# Patient Record
Sex: Female | Born: 1945 | Race: White | Hispanic: No | Marital: Married | Smoking: Never smoker
Health system: Southern US, Community
[De-identification: ages and names within clinical notes are randomized; demographics above are authoritative.]

## PROBLEM LIST (undated history)

## (undated) HISTORY — PX: CHOLECYSTECTOMY: SHX55

## (undated) HISTORY — PX: ABDOMINAL HYSTERECTOMY: SHX81

---

## 1997-11-30 ENCOUNTER — Ambulatory Visit (HOSPITAL_COMMUNITY): Admission: RE | Admit: 1997-11-30 | Discharge: 1997-11-30 | Payer: Self-pay | Admitting: Obstetrics and Gynecology

## 1998-12-02 ENCOUNTER — Encounter: Payer: Self-pay | Admitting: Obstetrics and Gynecology

## 1998-12-02 ENCOUNTER — Ambulatory Visit (HOSPITAL_COMMUNITY): Admission: RE | Admit: 1998-12-02 | Discharge: 1998-12-02 | Payer: Self-pay | Admitting: Obstetrics and Gynecology

## 1999-12-05 ENCOUNTER — Ambulatory Visit (HOSPITAL_COMMUNITY): Admission: RE | Admit: 1999-12-05 | Discharge: 1999-12-05 | Payer: Self-pay | Admitting: Internal Medicine

## 1999-12-05 ENCOUNTER — Encounter: Payer: Self-pay | Admitting: Internal Medicine

## 2000-12-05 ENCOUNTER — Ambulatory Visit (HOSPITAL_COMMUNITY): Admission: RE | Admit: 2000-12-05 | Discharge: 2000-12-05 | Payer: Self-pay | Admitting: Internal Medicine

## 2000-12-05 ENCOUNTER — Encounter: Payer: Self-pay | Admitting: Internal Medicine

## 2001-12-10 ENCOUNTER — Ambulatory Visit (HOSPITAL_COMMUNITY): Admission: RE | Admit: 2001-12-10 | Discharge: 2001-12-10 | Payer: Self-pay | Admitting: Internal Medicine

## 2001-12-10 ENCOUNTER — Encounter: Payer: Self-pay | Admitting: Internal Medicine

## 2003-01-04 ENCOUNTER — Ambulatory Visit (HOSPITAL_COMMUNITY): Admission: RE | Admit: 2003-01-04 | Discharge: 2003-01-04 | Payer: Self-pay | Admitting: Internal Medicine

## 2003-03-23 ENCOUNTER — Encounter (INDEPENDENT_AMBULATORY_CARE_PROVIDER_SITE_OTHER): Payer: Self-pay | Admitting: Specialist

## 2003-03-23 ENCOUNTER — Ambulatory Visit (HOSPITAL_COMMUNITY): Admission: RE | Admit: 2003-03-23 | Discharge: 2003-03-23 | Payer: Self-pay | Admitting: Gastroenterology

## 2004-01-10 ENCOUNTER — Ambulatory Visit (HOSPITAL_COMMUNITY): Admission: RE | Admit: 2004-01-10 | Discharge: 2004-01-10 | Payer: Self-pay | Admitting: Internal Medicine

## 2005-01-23 ENCOUNTER — Ambulatory Visit (HOSPITAL_COMMUNITY): Admission: RE | Admit: 2005-01-23 | Discharge: 2005-01-23 | Payer: Self-pay | Admitting: Internal Medicine

## 2006-01-24 ENCOUNTER — Encounter: Admission: RE | Admit: 2006-01-24 | Discharge: 2006-01-24 | Payer: Self-pay | Admitting: Internal Medicine

## 2007-01-27 ENCOUNTER — Encounter: Admission: RE | Admit: 2007-01-27 | Discharge: 2007-01-27 | Payer: Self-pay | Admitting: Internal Medicine

## 2008-01-29 ENCOUNTER — Encounter: Admission: RE | Admit: 2008-01-29 | Discharge: 2008-01-29 | Payer: Self-pay | Admitting: Internal Medicine

## 2009-01-31 ENCOUNTER — Encounter: Admission: RE | Admit: 2009-01-31 | Discharge: 2009-01-31 | Payer: Self-pay | Admitting: Internal Medicine

## 2009-02-12 ENCOUNTER — Emergency Department (HOSPITAL_COMMUNITY): Admission: EM | Admit: 2009-02-12 | Discharge: 2009-02-12 | Payer: Self-pay | Admitting: Emergency Medicine

## 2010-02-01 ENCOUNTER — Encounter
Admission: RE | Admit: 2010-02-01 | Discharge: 2010-02-01 | Payer: Self-pay | Source: Home / Self Care | Attending: Internal Medicine | Admitting: Internal Medicine

## 2011-01-01 ENCOUNTER — Other Ambulatory Visit: Payer: Self-pay | Admitting: Internal Medicine

## 2011-01-01 DIAGNOSIS — Z1231 Encounter for screening mammogram for malignant neoplasm of breast: Secondary | ICD-10-CM

## 2011-02-08 ENCOUNTER — Ambulatory Visit
Admission: RE | Admit: 2011-02-08 | Discharge: 2011-02-08 | Disposition: A | Discharge status: Home / Self Care | Payer: BC Managed Care – PPO | Source: Ambulatory Visit | Attending: Internal Medicine | Admitting: Internal Medicine

## 2011-02-08 DIAGNOSIS — Z1231 Encounter for screening mammogram for malignant neoplasm of breast: Secondary | ICD-10-CM

## 2012-01-01 ENCOUNTER — Other Ambulatory Visit: Payer: Self-pay | Admitting: Internal Medicine

## 2012-01-01 DIAGNOSIS — Z1231 Encounter for screening mammogram for malignant neoplasm of breast: Secondary | ICD-10-CM

## 2012-02-11 ENCOUNTER — Ambulatory Visit
Admission: RE | Admit: 2012-02-11 | Discharge: 2012-02-11 | Disposition: A | Discharge status: Home / Self Care | Payer: BC Managed Care – PPO | Source: Ambulatory Visit | Attending: Internal Medicine | Admitting: Internal Medicine

## 2012-02-11 DIAGNOSIS — Z1231 Encounter for screening mammogram for malignant neoplasm of breast: Secondary | ICD-10-CM

## 2013-01-16 ENCOUNTER — Other Ambulatory Visit: Payer: Self-pay

## 2013-01-16 DIAGNOSIS — Z1231 Encounter for screening mammogram for malignant neoplasm of breast: Secondary | ICD-10-CM

## 2013-02-24 ENCOUNTER — Ambulatory Visit
Admission: RE | Admit: 2013-02-24 | Discharge: 2013-02-24 | Disposition: A | Discharge status: Home / Self Care | Payer: BC Managed Care – PPO | Source: Ambulatory Visit

## 2013-02-24 DIAGNOSIS — Z1231 Encounter for screening mammogram for malignant neoplasm of breast: Secondary | ICD-10-CM

## 2013-10-27 ENCOUNTER — Ambulatory Visit
Admission: RE | Admit: 2013-10-27 | Discharge: 2013-10-27 | Disposition: A | Discharge status: Home / Self Care | Payer: BC Managed Care – PPO | Source: Ambulatory Visit | Attending: Internal Medicine | Admitting: Internal Medicine

## 2013-10-27 ENCOUNTER — Other Ambulatory Visit: Payer: Self-pay | Admitting: Internal Medicine

## 2013-10-27 DIAGNOSIS — M545 Low back pain: Secondary | ICD-10-CM

## 2013-10-29 ENCOUNTER — Other Ambulatory Visit: Payer: Self-pay | Admitting: Internal Medicine

## 2013-10-29 DIAGNOSIS — IMO0002 Reserved for concepts with insufficient information to code with codable children: Secondary | ICD-10-CM

## 2013-10-30 ENCOUNTER — Ambulatory Visit
Admission: RE | Admit: 2013-10-30 | Discharge: 2013-10-30 | Disposition: A | Discharge status: Home / Self Care | Payer: BC Managed Care – PPO | Source: Ambulatory Visit | Attending: Internal Medicine | Admitting: Internal Medicine

## 2013-10-30 DIAGNOSIS — IMO0002 Reserved for concepts with insufficient information to code with codable children: Secondary | ICD-10-CM

## 2013-11-05 ENCOUNTER — Other Ambulatory Visit (HOSPITAL_COMMUNITY): Payer: Self-pay | Admitting: Internal Medicine

## 2013-11-05 DIAGNOSIS — M549 Dorsalgia, unspecified: Secondary | ICD-10-CM

## 2013-11-05 DIAGNOSIS — IMO0002 Reserved for concepts with insufficient information to code with codable children: Secondary | ICD-10-CM

## 2013-11-11 ENCOUNTER — Ambulatory Visit (HOSPITAL_COMMUNITY): Admission: RE | Admit: 2013-11-11 | Payer: BC Managed Care – PPO | Source: Ambulatory Visit

## 2014-01-18 ENCOUNTER — Other Ambulatory Visit: Payer: Self-pay

## 2014-01-18 DIAGNOSIS — Z1231 Encounter for screening mammogram for malignant neoplasm of breast: Secondary | ICD-10-CM

## 2014-02-25 ENCOUNTER — Ambulatory Visit
Admission: RE | Admit: 2014-02-25 | Discharge: 2014-02-25 | Disposition: A | Discharge status: Home / Self Care | Payer: BLUE CROSS/BLUE SHIELD | Source: Ambulatory Visit

## 2014-02-25 DIAGNOSIS — Z1231 Encounter for screening mammogram for malignant neoplasm of breast: Secondary | ICD-10-CM

## 2014-11-25 ENCOUNTER — Other Ambulatory Visit: Payer: Self-pay | Admitting: Dermatology

## 2015-01-26 ENCOUNTER — Other Ambulatory Visit: Payer: Self-pay

## 2015-01-26 DIAGNOSIS — Z1231 Encounter for screening mammogram for malignant neoplasm of breast: Secondary | ICD-10-CM

## 2015-03-02 ENCOUNTER — Ambulatory Visit
Admission: RE | Admit: 2015-03-02 | Discharge: 2015-03-02 | Disposition: A | Discharge status: Home / Self Care | Payer: BLUE CROSS/BLUE SHIELD | Source: Ambulatory Visit

## 2015-03-02 DIAGNOSIS — Z1231 Encounter for screening mammogram for malignant neoplasm of breast: Secondary | ICD-10-CM

## 2015-05-17 DIAGNOSIS — H524 Presbyopia: Secondary | ICD-10-CM | POA: Diagnosis not present

## 2015-05-26 DIAGNOSIS — D2271 Melanocytic nevi of right lower limb, including hip: Secondary | ICD-10-CM | POA: Diagnosis not present

## 2015-05-26 DIAGNOSIS — L821 Other seborrheic keratosis: Secondary | ICD-10-CM | POA: Diagnosis not present

## 2015-05-26 DIAGNOSIS — D225 Melanocytic nevi of trunk: Secondary | ICD-10-CM | POA: Diagnosis not present

## 2015-05-26 DIAGNOSIS — D485 Neoplasm of uncertain behavior of skin: Secondary | ICD-10-CM | POA: Diagnosis not present

## 2015-05-26 DIAGNOSIS — Z872 Personal history of diseases of the skin and subcutaneous tissue: Secondary | ICD-10-CM | POA: Diagnosis not present

## 2015-05-26 DIAGNOSIS — L570 Actinic keratosis: Secondary | ICD-10-CM | POA: Diagnosis not present

## 2015-09-05 DIAGNOSIS — C44729 Squamous cell carcinoma of skin of left lower limb, including hip: Secondary | ICD-10-CM | POA: Diagnosis not present

## 2015-09-05 DIAGNOSIS — D485 Neoplasm of uncertain behavior of skin: Secondary | ICD-10-CM | POA: Diagnosis not present

## 2015-09-05 DIAGNOSIS — L570 Actinic keratosis: Secondary | ICD-10-CM | POA: Diagnosis not present

## 2015-10-19 DIAGNOSIS — C44722 Squamous cell carcinoma of skin of right lower limb, including hip: Secondary | ICD-10-CM | POA: Diagnosis not present

## 2015-10-19 DIAGNOSIS — D0472 Carcinoma in situ of skin of left lower limb, including hip: Secondary | ICD-10-CM | POA: Diagnosis not present

## 2015-11-01 DIAGNOSIS — L910 Hypertrophic scar: Secondary | ICD-10-CM | POA: Diagnosis not present

## 2016-01-26 ENCOUNTER — Other Ambulatory Visit: Payer: Self-pay | Admitting: Internal Medicine

## 2016-01-26 DIAGNOSIS — Z1231 Encounter for screening mammogram for malignant neoplasm of breast: Secondary | ICD-10-CM

## 2016-02-12 ENCOUNTER — Ambulatory Visit (HOSPITAL_COMMUNITY)
Admission: EM | Admit: 2016-02-12 | Discharge: 2016-02-12 | Disposition: A | Discharge status: Home / Self Care | Payer: BLUE CROSS/BLUE SHIELD | Attending: Emergency Medicine | Admitting: Emergency Medicine

## 2016-02-12 ENCOUNTER — Encounter (HOSPITAL_COMMUNITY): Payer: Self-pay

## 2016-02-12 DIAGNOSIS — J069 Acute upper respiratory infection, unspecified: Secondary | ICD-10-CM | POA: Diagnosis not present

## 2016-02-12 DIAGNOSIS — B9789 Other viral agents as the cause of diseases classified elsewhere: Secondary | ICD-10-CM | POA: Diagnosis not present

## 2016-02-12 MED ORDER — HYDROCODONE-HOMATROPINE 5-1.5 MG/5ML PO SYRP: 5.0000 mL | ORAL_SOLUTION | Freq: Four times a day (QID) | ORAL | 0 refills | Status: AC | PRN

## 2016-02-12 MED ORDER — PREDNISONE 20 MG PO TABS: 40.0000 mg | ORAL_TABLET | Freq: Every day | ORAL | 0 refills | Status: AC

## 2016-02-12 NOTE — ED Provider Notes (Signed)
CSN: [id number]     Arrival date & time 02/12/16  1203 History   First MD Initiated Contact with Patient 02/12/16 1232     Chief Complaint  Patient presents with  . URI   (Consider location/radiation/quality/duration/timing/severity/associated sxs/prior Treatment)  HPI   The patient is a 70 year old female presenting today with complaints of cough and congestion 9 days. 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 Denies shortness of breath at this time. Reports intermittent sore throat, coughing occasionally productive, nausea with excessive coughing, but denies vomiting abdominal pain, or diarrhea. Denies significant medical history, medications, or allergies. States she's been using Sudafed and Mucinex with little to no symptomatically relief.  History reviewed. No pertinent past medical history. Past Surgical History:  Procedure Laterality Date  . ABDOMINAL HYSTERECTOMY    . CHOLECYSTECTOMY     No family history on file. Social History  Substance Use Topics  . Smoking status: Never Smoker  . Smokeless tobacco: Never Used  . Alcohol use No   OB History    No data available     Review of Systems  Constitutional: Negative.  Negative for fatigue and fever.  HENT: Positive for congestion, sinus pressure, sneezing and sore throat. Negative for drooling, facial swelling, sinus pain and trouble swallowing.   Eyes: Negative.  Negative for visual disturbance.  Respiratory: Positive for cough and shortness of breath. Negative for choking, chest tightness and wheezing.   Cardiovascular: Negative.  Negative for chest pain and leg swelling.  Gastrointestinal: Positive for nausea. Negative for abdominal pain, diarrhea and vomiting.  Endocrine: Negative.   Genitourinary: Negative.   Musculoskeletal: Negative.  Negative for gait problem and neck  stiffness.  Skin: Negative.  Negative for rash.  Allergic/Immunologic: Negative.   Neurological: Negative.  Negative for dizziness and headaches.  Hematological: Negative.   Psychiatric/Behavioral: Negative.     Allergies  Demerol [meperidine]  Home Medications   Prior to Admission medications   Medication Sig Start Date End Date Taking? Authorizing Provider  HYDROcodone-homatropine (HYCODAN) 5-1.5 MG/5ML syrup Take 5 mLs by mouth every 6 (six) hours as needed for cough. 02/12/16   Servando Salinaatherine H Rossi, NP  predniSONE (DELTASONE) 20 MG tablet Take 2 tablets (40 mg total) by mouth daily. 02/12/16   Servando Salinaatherine H Rossi, NP   Meds Ordered and Administered this Visit  Medications - No data to display  BP 158/84 (BP Location: Left Arm)   Pulse 95   Temp 98.6 F (37 C) (Oral)   Resp 16   SpO2 100%  No data found.   Physical Exam  Constitutional: She is oriented to person, place, and time. She appears well-developed and well-nourished. No distress.  HENT:  Head: Normocephalic and atraumatic.  Right Ear: External ear normal.  Left Ear: External ear normal.  Bilateral nares are paten,t but the exterior of the nares are red and tender which patient reports is due to  Excessive nasal draining and blowing her nose. Bilateral tympanic membranes pearly gray in appearance with light reflexes present and bony prominences visualized. Some clear fluid present bilaterally behind bilateral tympanic membranes.   Eyes: Conjunctivae are normal. Pupils are equal, round, and reactive to light. Right eye exhibits no discharge. Left eye exhibits no discharge. No scleral icterus.  Neck: Normal range of motion. Neck supple. No thyromegaly present.  Negative for nuchal rigidity.  Cardiovascular: Normal rate, regular rhythm,  normal heart sounds and intact distal pulses.  Exam reveals no gallop and no friction rub.   No murmur heard. Pulmonary/Chest: Effort normal and breath sounds normal. No respiratory  distress. She has no wheezes. She has no rales. She exhibits no tenderness.  Lymphadenopathy:    She has no cervical adenopathy.  Neurological: She is alert and oriented to person, place, and time.  Skin: Skin is warm and dry. No rash noted. She is not diaphoretic. Pallor: .medsthis.  Nursing note and vitals reviewed.   Urgent Care Course   Clinical Course     Procedures (including critical care time)  Labs Review Labs Reviewed - No data to display  Imaging Review No results found.  Discussed that comfort care measures and time would be the most effective with treating the symptoms of a viral illness. Advised patient to follow up with primary care provider should symptoms worsen or fail to improve. Patient was provided with prednisone burst which she requested to assist with coughing fits and stated that this is helped her in the past. Patient agrees to not fill prior to trying other methods such as saline irrigation.   MDM   1. Viral URI with cough    Meds ordered this encounter  Medications  . HYDROcodone-homatropine (HYCODAN) 5-1.5 MG/5ML syrup    Sig: Take 5 mLs by mouth every 6 (six) hours as needed for cough.    Dispense:  120 mL    Refill:  0  . predniSONE (DELTASONE) 20 MG tablet    Sig: Take 2 tablets (40 mg total) by mouth daily.    Dispense:  10 tablet    Refill:  0   The usual and customary discharge instructions and warnings were given.  The patient verbalizes understanding and agrees to plan of care.       Servando Salinaatherine H Rossi, NP 02/12/16 1320

## 2016-02-12 NOTE — Discharge Instructions (Signed)
A neti pot is a container designed to rinse debris or mucus from your nasal cavity. You might use a neti pot to treat symptoms of nasal allergies, sinus problems or colds. °If you choose to make your own saltwater solution, it's important to use bottled water that has been distilled or sterilized. Tap water is acceptable if it's been boiled for several minutes and then left to cool until it is lukewarm. °To use the neti pot, tilt your head sideways over the sink and place the spout of the neti pot in the upper nostril. Breathing through your open mouth, gently pour the saltwater solution into your upper nostril so that the liquid drains through the lower nostril. Repeat on the other side. °Be sure to rinse the irrigation device after each use with similarly distilled, sterile, previously boiled and cooled, or filtered water and leave open to air dry. °Neti pots are often available in pharmacies and health food stores, as well online.  ° °

## 2016-02-12 NOTE — ED Triage Notes (Signed)
Pt has been sick for 9 days. Productive Cough, worse at night. Getting green mucus tinted with blood when she blows her nose. Sneezing, headache, no fever. Taking mucinex DM.

## 2016-02-15 DIAGNOSIS — J0100 Acute maxillary sinusitis, unspecified: Secondary | ICD-10-CM | POA: Diagnosis not present

## 2016-02-15 DIAGNOSIS — K219 Gastro-esophageal reflux disease without esophagitis: Secondary | ICD-10-CM | POA: Diagnosis not present

## 2016-03-07 ENCOUNTER — Ambulatory Visit
Admission: RE | Admit: 2016-03-07 | Discharge: 2016-03-07 | Disposition: A | Discharge status: Home / Self Care | Payer: BLUE CROSS/BLUE SHIELD | Source: Ambulatory Visit | Attending: Internal Medicine | Admitting: Internal Medicine

## 2016-03-07 DIAGNOSIS — Z1231 Encounter for screening mammogram for malignant neoplasm of breast: Secondary | ICD-10-CM

## 2016-04-25 DIAGNOSIS — Z79899 Other long term (current) drug therapy: Secondary | ICD-10-CM | POA: Diagnosis not present

## 2016-04-25 DIAGNOSIS — R3129 Other microscopic hematuria: Secondary | ICD-10-CM | POA: Diagnosis not present

## 2016-04-25 DIAGNOSIS — Z0000 Encounter for general adult medical examination without abnormal findings: Secondary | ICD-10-CM | POA: Diagnosis not present

## 2016-04-25 DIAGNOSIS — J309 Allergic rhinitis, unspecified: Secondary | ICD-10-CM | POA: Diagnosis not present

## 2016-04-25 DIAGNOSIS — E559 Vitamin D deficiency, unspecified: Secondary | ICD-10-CM | POA: Diagnosis not present

## 2016-04-25 DIAGNOSIS — K219 Gastro-esophageal reflux disease without esophagitis: Secondary | ICD-10-CM | POA: Diagnosis not present

## 2016-04-25 DIAGNOSIS — E782 Mixed hyperlipidemia: Secondary | ICD-10-CM | POA: Diagnosis not present

## 2016-05-22 DIAGNOSIS — H5203 Hypermetropia, bilateral: Secondary | ICD-10-CM | POA: Diagnosis not present

## 2016-05-24 DIAGNOSIS — Z85828 Personal history of other malignant neoplasm of skin: Secondary | ICD-10-CM | POA: Diagnosis not present

## 2016-05-24 DIAGNOSIS — C44612 Basal cell carcinoma of skin of right upper limb, including shoulder: Secondary | ICD-10-CM | POA: Diagnosis not present

## 2016-05-24 DIAGNOSIS — D485 Neoplasm of uncertain behavior of skin: Secondary | ICD-10-CM | POA: Diagnosis not present

## 2016-05-24 DIAGNOSIS — L821 Other seborrheic keratosis: Secondary | ICD-10-CM | POA: Diagnosis not present

## 2016-05-24 DIAGNOSIS — L814 Other melanin hyperpigmentation: Secondary | ICD-10-CM | POA: Diagnosis not present

## 2016-05-24 DIAGNOSIS — L820 Inflamed seborrheic keratosis: Secondary | ICD-10-CM | POA: Diagnosis not present

## 2016-05-24 DIAGNOSIS — D1801 Hemangioma of skin and subcutaneous tissue: Secondary | ICD-10-CM | POA: Diagnosis not present

## 2016-06-15 DIAGNOSIS — C44519 Basal cell carcinoma of skin of other part of trunk: Secondary | ICD-10-CM | POA: Diagnosis not present

## 2016-12-18 DIAGNOSIS — L910 Hypertrophic scar: Secondary | ICD-10-CM | POA: Diagnosis not present

## 2016-12-18 DIAGNOSIS — L821 Other seborrheic keratosis: Secondary | ICD-10-CM | POA: Diagnosis not present

## 2016-12-18 DIAGNOSIS — D485 Neoplasm of uncertain behavior of skin: Secondary | ICD-10-CM | POA: Diagnosis not present

## 2017-01-23 DIAGNOSIS — R350 Frequency of micturition: Secondary | ICD-10-CM | POA: Diagnosis not present

## 2017-01-23 DIAGNOSIS — R300 Dysuria: Secondary | ICD-10-CM | POA: Diagnosis not present

## 2017-02-15 ENCOUNTER — Other Ambulatory Visit: Payer: Self-pay | Admitting: Internal Medicine

## 2017-02-15 DIAGNOSIS — Z139 Encounter for screening, unspecified: Secondary | ICD-10-CM

## 2017-03-08 ENCOUNTER — Ambulatory Visit
Admission: RE | Admit: 2017-03-08 | Discharge: 2017-03-08 | Disposition: A | Discharge status: Home / Self Care | Payer: BLUE CROSS/BLUE SHIELD | Source: Ambulatory Visit | Attending: Internal Medicine | Admitting: Internal Medicine

## 2017-03-08 DIAGNOSIS — Z1231 Encounter for screening mammogram for malignant neoplasm of breast: Secondary | ICD-10-CM | POA: Diagnosis not present

## 2017-03-08 DIAGNOSIS — Z139 Encounter for screening, unspecified: Secondary | ICD-10-CM

## 2017-04-26 DIAGNOSIS — Z79899 Other long term (current) drug therapy: Secondary | ICD-10-CM | POA: Diagnosis not present

## 2017-04-26 DIAGNOSIS — Z86010 Personal history of colonic polyps: Secondary | ICD-10-CM | POA: Diagnosis not present

## 2017-04-26 DIAGNOSIS — R3129 Other microscopic hematuria: Secondary | ICD-10-CM | POA: Diagnosis not present

## 2017-04-26 DIAGNOSIS — E782 Mixed hyperlipidemia: Secondary | ICD-10-CM | POA: Diagnosis not present

## 2017-04-26 DIAGNOSIS — Z0000 Encounter for general adult medical examination without abnormal findings: Secondary | ICD-10-CM | POA: Diagnosis not present

## 2017-04-26 DIAGNOSIS — E559 Vitamin D deficiency, unspecified: Secondary | ICD-10-CM | POA: Diagnosis not present

## 2017-06-19 DIAGNOSIS — L821 Other seborrheic keratosis: Secondary | ICD-10-CM | POA: Diagnosis not present

## 2017-06-19 DIAGNOSIS — D485 Neoplasm of uncertain behavior of skin: Secondary | ICD-10-CM | POA: Diagnosis not present

## 2017-12-04 DIAGNOSIS — L570 Actinic keratosis: Secondary | ICD-10-CM | POA: Diagnosis not present

## 2017-12-04 DIAGNOSIS — L814 Other melanin hyperpigmentation: Secondary | ICD-10-CM | POA: Diagnosis not present

## 2017-12-04 DIAGNOSIS — Z85828 Personal history of other malignant neoplasm of skin: Secondary | ICD-10-CM | POA: Diagnosis not present

## 2017-12-04 DIAGNOSIS — D225 Melanocytic nevi of trunk: Secondary | ICD-10-CM | POA: Diagnosis not present

## 2017-12-04 DIAGNOSIS — D485 Neoplasm of uncertain behavior of skin: Secondary | ICD-10-CM | POA: Diagnosis not present

## 2017-12-04 DIAGNOSIS — L821 Other seborrheic keratosis: Secondary | ICD-10-CM | POA: Diagnosis not present

## 2018-01-28 ENCOUNTER — Other Ambulatory Visit: Payer: Self-pay | Admitting: Internal Medicine

## 2018-01-28 DIAGNOSIS — Z1231 Encounter for screening mammogram for malignant neoplasm of breast: Secondary | ICD-10-CM

## 2018-03-10 ENCOUNTER — Ambulatory Visit
Admission: RE | Admit: 2018-03-10 | Discharge: 2018-03-10 | Disposition: A | Discharge status: Home / Self Care | Payer: BLUE CROSS/BLUE SHIELD | Source: Ambulatory Visit | Attending: Internal Medicine | Admitting: Internal Medicine

## 2018-03-10 DIAGNOSIS — Z1231 Encounter for screening mammogram for malignant neoplasm of breast: Secondary | ICD-10-CM

## 2018-05-05 DIAGNOSIS — R350 Frequency of micturition: Secondary | ICD-10-CM | POA: Diagnosis not present

## 2018-05-05 DIAGNOSIS — R300 Dysuria: Secondary | ICD-10-CM | POA: Diagnosis not present

## 2018-08-20 ENCOUNTER — Emergency Department (HOSPITAL_COMMUNITY)
Admission: EM | Admit: 2018-08-20 | Discharge: 2018-08-20 | Disposition: A | Discharge status: Home / Self Care | Payer: BC Managed Care – PPO | Attending: Emergency Medicine | Admitting: Emergency Medicine

## 2018-08-20 ENCOUNTER — Emergency Department (HOSPITAL_COMMUNITY): Payer: BC Managed Care – PPO

## 2018-08-20 DIAGNOSIS — R079 Chest pain, unspecified: Secondary | ICD-10-CM | POA: Diagnosis not present

## 2018-08-20 DIAGNOSIS — Z79899 Other long term (current) drug therapy: Secondary | ICD-10-CM | POA: Diagnosis not present

## 2018-08-20 DIAGNOSIS — R0789 Other chest pain: Secondary | ICD-10-CM | POA: Diagnosis not present

## 2018-08-20 DIAGNOSIS — Z7982 Long term (current) use of aspirin: Secondary | ICD-10-CM | POA: Diagnosis not present

## 2018-08-20 LAB — BASIC METABOLIC PANEL
Anion gap: 12 (ref 5–15)
BUN: 12 mg/dL (ref 8–23)
CO2: 24 mmol/L (ref 22–32)
Calcium: 9.9 mg/dL (ref 8.9–10.3)
Chloride: 103 mmol/L (ref 98–111)
Creatinine, Ser: 0.87 mg/dL (ref 0.44–1.00)
GFR calc Af Amer: >60 mL/min (ref >60)
GFR calc non Af Amer: >60 mL/min (ref >60)
Glucose, Bld: 108 mg/dL — ABNORMAL HIGH (ref 70–99)
Potassium: 4 mmol/L (ref 3.5–5.1)
Sodium: 139 mmol/L (ref 135–145)

## 2018-08-20 LAB — CBC
HCT: 40.5 % (ref 36.0–46.0)
Hemoglobin: 13 g/dL (ref 12.0–15.0)
MCH: 29.9 pg (ref 26.0–34.0)
MCHC: 32.1 g/dL (ref 30.0–36.0)
MCV: 93.1 fL (ref 80.0–100.0)
Platelets: 422 10*3/uL — ABNORMAL HIGH (ref 150–400)
RBC: 4.35 MIL/uL (ref 3.87–5.11)
RDW: 12.7 % (ref 11.5–15.5)
WBC: 9.1 10*3/uL (ref 4.0–10.5)
nRBC: 0 % (ref 0.0–0.2)

## 2018-08-20 LAB — TROPONIN I (HIGH SENSITIVITY)
Troponin I (High Sensitivity): 3 ng/L (ref <18)
Troponin I (High Sensitivity): 3 ng/L (ref <18)

## 2018-08-20 MED ORDER — SODIUM CHLORIDE 0.9% FLUSH: 3.0000 mL | Freq: Once | INTRAVENOUS | Status: DC

## 2018-08-20 NOTE — ED Notes (Signed)
Dr.Lockwood at bedside  

## 2018-08-20 NOTE — ED Provider Notes (Signed)
[facility] Provider Note   CSN: [id number] Arrival date & time: 08/20/18  1139     History   Chief Complaint Chief Complaint  Patient presents with  . Chest Pain    HPI Alyssa Rogers is a 73 y.o. female.     HPI  Patient presents with episodic twinges of chest discomfort. She was fine until 2 days ago, then without, clear precipitation, she began having episodes of brief chest discomfort left upper chest, described as sharp, quick, resolving without any specific intervention. No dyspnea, no fever, no cough, no chills, no abdominal pain Patient states that she is generally well, has no history of cardiac disease, nor has she seen a cardiologist.  With ongoing symptoms over the past 48 hours she presents for evaluation. Patient does not smoke, continues working at a desk job, states that she is healthy, has no recent medication change, diet change, activity change.  No past medical history on file.  There are no active problems to display for this patient.   Past Surgical History:  Procedure Laterality Date  . ABDOMINAL HYSTERECTOMY    . CHOLECYSTECTOMY       OB History   No obstetric history on file.      Home Medications    Prior to Admission medications   Medication Sig Start Date End Date Taking? Authorizing Provider  aspirin EC 81 MG tablet Take 81 mg by mouth daily.   Yes [provider]  Cholecalciferol (VITAMIN D-3) 125 MCG (5000 UT) TABS Take 5,000 Units by mouth daily.   Yes [provider]  Lactobacillus-Inulin (CULTURELLE HEALTH & WELLNESS PO) Take 1 tablet by mouth daily.   Yes [provider]  Multiple Vitamin (MULTIVITAMIN) capsule Take 1 capsule by mouth daily. Women's ultra Mega without iron & Iodine   Yes [provider]  pantoprazole (PROTONIX) 40 MG tablet Take 40 mg by mouth as needed (stomach pain).   Yes [provider]  HYDROcodone-homatropine (HYCODAN)  5-1.5 MG/5ML syrup Take 5 mLs by mouth every 6 (six) hours as needed for cough. Patient not taking: Reported on 08/20/2018 02/12/16   Servando Salinaossi, Catherine H, NP  predniSONE (DELTASONE) 20 MG tablet Take 2 tablets (40 mg total) by mouth daily. Patient not taking: Reported on 08/20/2018 02/12/16   Servando Salinaossi, Catherine H, NP    Family History Family History  Problem Relation Age of Onset  . Breast cancer Sister     Social History Social History   Tobacco Use  . Smoking status: Never Smoker  . Smokeless tobacco: Never Used  Substance Use Topics  . Alcohol use: No  . Drug use: Not on file     Allergies   Demerol [meperidine] and Sulindac   Review of Systems Review of Systems  Constitutional:       Per HPI, otherwise negative  HENT:       Per HPI, otherwise negative  Respiratory:       Per HPI, otherwise negative  Cardiovascular:       Per HPI, otherwise negative  Gastrointestinal: Negative for vomiting.  Endocrine:       Negative aside from HPI  Genitourinary:       Neg aside from HPI   Musculoskeletal:       Per HPI, otherwise negative  Skin: Negative.   Neurological: Negative for syncope.     Physical Exam Updated Vital Signs BP (!) 156/73   Pulse 89   Temp 98.6 F (37  C) (Oral)   Resp 16   SpO2 100%   Physical Exam Vitals signs and nursing note reviewed.  Constitutional:      General: She is not in acute distress.    Appearance: She is well-developed.  HENT:     Head: Normocephalic and atraumatic.  Eyes:     Conjunctiva/sclera: Conjunctivae normal.  Cardiovascular:     Rate and Rhythm: Normal rate and regular rhythm.  Pulmonary:     Effort: Pulmonary effort is normal. No respiratory distress.     Breath sounds: Normal breath sounds. No stridor.  Abdominal:     General: There is no distension.  Skin:    General: Skin is warm and dry.  Neurological:     Mental Status: She is alert and oriented to person, place, and time.     Cranial Nerves: No cranial  nerve deficit.      ED Treatments / Results  Labs (all labs ordered are listed, but only abnormal results are displayed) Labs Reviewed  BASIC METABOLIC PANEL - Abnormal; Notable for the following components:      Result Value   Glucose, Bld 108 (*)    All other components within normal limits  CBC - Abnormal; Notable for the following components:   Platelets 422 (*)    All other components within normal limits  TROPONIN I (HIGH SENSITIVITY)  TROPONIN I (HIGH SENSITIVITY)    EKG EKG Interpretation  Date/Time:  Wednesday August 20 2018 11:46:48 EDT Ventricular Rate:  115 PR Interval:  166 QRS Duration: 76 QT Interval:  324 QTC Calculation: 448 R Axis:   46 Text Interpretation:  Sinus tachycardia Otherwise normal ECG Confirmed by Carmin Muskrat [phone]) on 08/20/2018 3:26:29 PM   Radiology Dg Chest 2 View  Result Date: 08/20/2018 CLINICAL DATA:  Chest pain. EXAM: CHEST - 2 VIEW COMPARISON:  Radiographs of October 27, 2013. FINDINGS: The heart size and mediastinal contours are within normal limits. Both lungs are clear. No pneumothorax or pleural effusion is noted. Atherosclerosis of thoracic aorta is noted. Stable old L1 compression fracture is noted. IMPRESSION: No active cardiopulmonary disease. Aortic Atherosclerosis (ICD10-I70.0). Electronically Signed   By: Marijo Conception M.D.   On: 08/20/2018 12:14    Procedures Procedures (including critical care time)  Medications Ordered in ED Medications  sodium chloride flush (NS) 0.9 % injection 3 mL (has no administration in time range)     Initial Impression / Assessment and Plan / ED Course  I have reviewed the triage vital signs and the nursing notes.  Pertinent labs & imaging results that were available during my care of the patient were reviewed by me and considered in my medical decision making (see chart for details).   This patient presented with chest pain.  The evaluation here has been reassuring, with no  evidence of ongoing coronary ischemia, infection or other acute new pathology.  Vitals and labs are consistent with the reassuring physical exam.  The patient is appropriate for further evaluation and management as an outpatient.  Final Clinical Impressions(s) / ED Diagnoses   Final diagnoses:  Atypical chest pain     Carmin Muskrat, MD 08/20/18 1529

## 2018-08-20 NOTE — Discharge Instructions (Addendum)
As discussed, your evaluation today has been largely reassuring.  But, it is important that you monitor your condition carefully, and do not hesitate to return to the ED if you develop new, or concerning changes in your condition. ? ?Otherwise, please follow-up with your physician for appropriate ongoing care. ? ?

## 2018-08-20 NOTE — ED Triage Notes (Signed)
Pt reports chest pain on and off for the last 3 days. Pt states the pain is also in her back and bilateral arm tingling.

## 2018-09-16 DIAGNOSIS — E559 Vitamin D deficiency, unspecified: Secondary | ICD-10-CM | POA: Diagnosis not present

## 2018-09-16 DIAGNOSIS — Z0000 Encounter for general adult medical examination without abnormal findings: Secondary | ICD-10-CM | POA: Diagnosis not present

## 2018-09-16 DIAGNOSIS — E782 Mixed hyperlipidemia: Secondary | ICD-10-CM | POA: Diagnosis not present

## 2018-09-16 DIAGNOSIS — R3129 Other microscopic hematuria: Secondary | ICD-10-CM | POA: Diagnosis not present

## 2018-09-16 DIAGNOSIS — Z79899 Other long term (current) drug therapy: Secondary | ICD-10-CM | POA: Diagnosis not present

## 2018-09-16 DIAGNOSIS — K219 Gastro-esophageal reflux disease without esophagitis: Secondary | ICD-10-CM | POA: Diagnosis not present

## 2018-09-16 DIAGNOSIS — Z86010 Personal history of colonic polyps: Secondary | ICD-10-CM | POA: Diagnosis not present

## 2018-11-21 DIAGNOSIS — Z23 Encounter for immunization: Secondary | ICD-10-CM | POA: Diagnosis not present

## 2018-12-09 DIAGNOSIS — D485 Neoplasm of uncertain behavior of skin: Secondary | ICD-10-CM | POA: Diagnosis not present

## 2018-12-09 DIAGNOSIS — L821 Other seborrheic keratosis: Secondary | ICD-10-CM | POA: Diagnosis not present

## 2018-12-09 DIAGNOSIS — D2271 Melanocytic nevi of right lower limb, including hip: Secondary | ICD-10-CM | POA: Diagnosis not present

## 2018-12-09 DIAGNOSIS — Z23 Encounter for immunization: Secondary | ICD-10-CM | POA: Diagnosis not present

## 2018-12-09 DIAGNOSIS — L820 Inflamed seborrheic keratosis: Secondary | ICD-10-CM | POA: Diagnosis not present

## 2019-02-02 ENCOUNTER — Other Ambulatory Visit: Payer: Self-pay | Admitting: Internal Medicine

## 2019-02-02 DIAGNOSIS — Z1231 Encounter for screening mammogram for malignant neoplasm of breast: Secondary | ICD-10-CM

## 2019-03-23 ENCOUNTER — Ambulatory Visit
Admission: RE | Admit: 2019-03-23 | Discharge: 2019-03-23 | Disposition: A | Discharge status: Home / Self Care | Payer: BC Managed Care – PPO | Source: Ambulatory Visit | Attending: Internal Medicine | Admitting: Internal Medicine

## 2019-03-23 ENCOUNTER — Other Ambulatory Visit: Payer: Self-pay

## 2019-03-23 DIAGNOSIS — Z1231 Encounter for screening mammogram for malignant neoplasm of breast: Secondary | ICD-10-CM

## 2019-05-02 ENCOUNTER — Ambulatory Visit: Payer: BC Managed Care – PPO

## 2019-05-09 ENCOUNTER — Ambulatory Visit: Payer: BC Managed Care – PPO

## 2019-09-23 DIAGNOSIS — Z79899 Other long term (current) drug therapy: Secondary | ICD-10-CM | POA: Diagnosis not present

## 2019-09-23 DIAGNOSIS — Z86010 Personal history of colonic polyps: Secondary | ICD-10-CM | POA: Diagnosis not present

## 2019-09-23 DIAGNOSIS — E559 Vitamin D deficiency, unspecified: Secondary | ICD-10-CM | POA: Diagnosis not present

## 2019-09-23 DIAGNOSIS — E782 Mixed hyperlipidemia: Secondary | ICD-10-CM | POA: Diagnosis not present

## 2019-09-23 DIAGNOSIS — K219 Gastro-esophageal reflux disease without esophagitis: Secondary | ICD-10-CM | POA: Diagnosis not present

## 2019-09-23 DIAGNOSIS — Z0000 Encounter for general adult medical examination without abnormal findings: Secondary | ICD-10-CM | POA: Diagnosis not present

## 2019-09-23 DIAGNOSIS — R3129 Other microscopic hematuria: Secondary | ICD-10-CM | POA: Diagnosis not present

## 2019-10-15 DIAGNOSIS — Z1152 Encounter for screening for COVID-19: Secondary | ICD-10-CM | POA: Diagnosis not present

## 2019-10-15 DIAGNOSIS — Z03818 Encounter for observation for suspected exposure to other biological agents ruled out: Secondary | ICD-10-CM | POA: Diagnosis not present

## 2019-12-17 DIAGNOSIS — L821 Other seborrheic keratosis: Secondary | ICD-10-CM | POA: Diagnosis not present

## 2019-12-17 DIAGNOSIS — D485 Neoplasm of uncertain behavior of skin: Secondary | ICD-10-CM | POA: Diagnosis not present

## 2019-12-17 DIAGNOSIS — Z85828 Personal history of other malignant neoplasm of skin: Secondary | ICD-10-CM | POA: Diagnosis not present

## 2019-12-17 DIAGNOSIS — D225 Melanocytic nevi of trunk: Secondary | ICD-10-CM | POA: Diagnosis not present

## 2019-12-17 DIAGNOSIS — L814 Other melanin hyperpigmentation: Secondary | ICD-10-CM | POA: Diagnosis not present

## 2019-12-17 DIAGNOSIS — B079 Viral wart, unspecified: Secondary | ICD-10-CM | POA: Diagnosis not present

## 2020-02-16 ENCOUNTER — Other Ambulatory Visit: Payer: Self-pay | Admitting: Internal Medicine

## 2020-02-16 DIAGNOSIS — Z1231 Encounter for screening mammogram for malignant neoplasm of breast: Secondary | ICD-10-CM

## 2020-03-25 ENCOUNTER — Other Ambulatory Visit: Payer: Self-pay

## 2020-03-25 ENCOUNTER — Ambulatory Visit
Admission: RE | Admit: 2020-03-25 | Discharge: 2020-03-25 | Disposition: A | Discharge status: Home / Self Care | Payer: BC Managed Care – PPO | Source: Ambulatory Visit | Attending: Internal Medicine | Admitting: Internal Medicine

## 2020-03-25 DIAGNOSIS — Z1231 Encounter for screening mammogram for malignant neoplasm of breast: Secondary | ICD-10-CM

## 2020-03-30 ENCOUNTER — Other Ambulatory Visit: Payer: Self-pay | Admitting: Internal Medicine

## 2020-03-30 DIAGNOSIS — R928 Other abnormal and inconclusive findings on diagnostic imaging of breast: Secondary | ICD-10-CM

## 2020-04-14 ENCOUNTER — Other Ambulatory Visit: Payer: Self-pay

## 2020-04-14 ENCOUNTER — Other Ambulatory Visit: Payer: Self-pay | Admitting: Internal Medicine

## 2020-04-14 ENCOUNTER — Ambulatory Visit
Admission: RE | Admit: 2020-04-14 | Discharge: 2020-04-14 | Disposition: A | Discharge status: Home / Self Care | Payer: BC Managed Care – PPO | Source: Ambulatory Visit | Attending: Internal Medicine | Admitting: Internal Medicine

## 2020-04-14 DIAGNOSIS — R922 Inconclusive mammogram: Secondary | ICD-10-CM | POA: Diagnosis not present

## 2020-04-14 DIAGNOSIS — R921 Mammographic calcification found on diagnostic imaging of breast: Secondary | ICD-10-CM

## 2020-04-14 DIAGNOSIS — R928 Other abnormal and inconclusive findings on diagnostic imaging of breast: Secondary | ICD-10-CM

## 2020-04-25 ENCOUNTER — Ambulatory Visit
Admission: RE | Admit: 2020-04-25 | Discharge: 2020-04-25 | Disposition: A | Discharge status: Home / Self Care | Payer: BC Managed Care – PPO | Source: Ambulatory Visit | Attending: Internal Medicine | Admitting: Internal Medicine

## 2020-04-25 ENCOUNTER — Other Ambulatory Visit: Payer: Self-pay

## 2020-04-25 DIAGNOSIS — R921 Mammographic calcification found on diagnostic imaging of breast: Secondary | ICD-10-CM

## 2020-04-25 DIAGNOSIS — N6489 Other specified disorders of breast: Secondary | ICD-10-CM | POA: Diagnosis not present

## 2020-05-05 DIAGNOSIS — R300 Dysuria: Secondary | ICD-10-CM | POA: Diagnosis not present

## 2020-09-22 DIAGNOSIS — Z0000 Encounter for general adult medical examination without abnormal findings: Secondary | ICD-10-CM | POA: Diagnosis not present

## 2020-09-22 DIAGNOSIS — E782 Mixed hyperlipidemia: Secondary | ICD-10-CM | POA: Diagnosis not present

## 2020-09-22 DIAGNOSIS — Z86010 Personal history of colonic polyps: Secondary | ICD-10-CM | POA: Diagnosis not present

## 2020-09-22 DIAGNOSIS — E559 Vitamin D deficiency, unspecified: Secondary | ICD-10-CM | POA: Diagnosis not present

## 2020-09-22 DIAGNOSIS — R3129 Other microscopic hematuria: Secondary | ICD-10-CM | POA: Diagnosis not present

## 2020-09-22 DIAGNOSIS — Z79899 Other long term (current) drug therapy: Secondary | ICD-10-CM | POA: Diagnosis not present

## 2020-09-22 DIAGNOSIS — K219 Gastro-esophageal reflux disease without esophagitis: Secondary | ICD-10-CM | POA: Diagnosis not present

## 2020-12-20 DIAGNOSIS — L814 Other melanin hyperpigmentation: Secondary | ICD-10-CM | POA: Diagnosis not present

## 2020-12-20 DIAGNOSIS — L821 Other seborrheic keratosis: Secondary | ICD-10-CM | POA: Diagnosis not present

## 2020-12-20 DIAGNOSIS — D225 Melanocytic nevi of trunk: Secondary | ICD-10-CM | POA: Diagnosis not present

## 2020-12-20 DIAGNOSIS — Z85828 Personal history of other malignant neoplasm of skin: Secondary | ICD-10-CM | POA: Diagnosis not present

## 2020-12-20 DIAGNOSIS — L570 Actinic keratosis: Secondary | ICD-10-CM | POA: Diagnosis not present

## 2021-05-02 ENCOUNTER — Other Ambulatory Visit: Payer: Self-pay | Admitting: Internal Medicine

## 2021-05-02 DIAGNOSIS — Z1231 Encounter for screening mammogram for malignant neoplasm of breast: Secondary | ICD-10-CM

## 2021-05-16 ENCOUNTER — Ambulatory Visit
Admission: RE | Admit: 2021-05-16 | Discharge: 2021-05-16 | Disposition: A | Discharge status: Home / Self Care | Payer: BC Managed Care – PPO | Source: Ambulatory Visit | Attending: Internal Medicine | Admitting: Internal Medicine

## 2021-05-16 DIAGNOSIS — Z1231 Encounter for screening mammogram for malignant neoplasm of breast: Secondary | ICD-10-CM | POA: Diagnosis not present

## 2021-10-02 DIAGNOSIS — E559 Vitamin D deficiency, unspecified: Secondary | ICD-10-CM | POA: Diagnosis not present

## 2021-10-02 DIAGNOSIS — J309 Allergic rhinitis, unspecified: Secondary | ICD-10-CM | POA: Diagnosis not present

## 2021-10-02 DIAGNOSIS — K219 Gastro-esophageal reflux disease without esophagitis: Secondary | ICD-10-CM | POA: Diagnosis not present

## 2021-10-02 DIAGNOSIS — Z0000 Encounter for general adult medical examination without abnormal findings: Secondary | ICD-10-CM | POA: Diagnosis not present

## 2021-10-02 DIAGNOSIS — Z79899 Other long term (current) drug therapy: Secondary | ICD-10-CM | POA: Diagnosis not present

## 2021-10-02 DIAGNOSIS — R3129 Other microscopic hematuria: Secondary | ICD-10-CM | POA: Diagnosis not present

## 2021-10-02 DIAGNOSIS — E782 Mixed hyperlipidemia: Secondary | ICD-10-CM | POA: Diagnosis not present

## 2021-12-19 DIAGNOSIS — D225 Melanocytic nevi of trunk: Secondary | ICD-10-CM | POA: Diagnosis not present

## 2021-12-19 DIAGNOSIS — L578 Other skin changes due to chronic exposure to nonionizing radiation: Secondary | ICD-10-CM | POA: Diagnosis not present

## 2021-12-19 DIAGNOSIS — L570 Actinic keratosis: Secondary | ICD-10-CM | POA: Diagnosis not present

## 2021-12-19 DIAGNOSIS — L820 Inflamed seborrheic keratosis: Secondary | ICD-10-CM | POA: Diagnosis not present

## 2021-12-19 DIAGNOSIS — L814 Other melanin hyperpigmentation: Secondary | ICD-10-CM | POA: Diagnosis not present

## 2021-12-19 DIAGNOSIS — L821 Other seborrheic keratosis: Secondary | ICD-10-CM | POA: Diagnosis not present

## 2022-05-25 ENCOUNTER — Other Ambulatory Visit: Payer: Self-pay | Admitting: Physician Assistant

## 2022-05-25 DIAGNOSIS — Z1231 Encounter for screening mammogram for malignant neoplasm of breast: Secondary | ICD-10-CM

## 2022-07-03 ENCOUNTER — Ambulatory Visit
Admission: RE | Admit: 2022-07-03 | Discharge: 2022-07-03 | Disposition: A | Discharge status: Home / Self Care | Payer: BC Managed Care – PPO | Source: Ambulatory Visit | Attending: Physician Assistant | Admitting: Physician Assistant

## 2022-07-03 DIAGNOSIS — Z1231 Encounter for screening mammogram for malignant neoplasm of breast: Secondary | ICD-10-CM | POA: Diagnosis not present

## 2022-08-02 IMAGING — MG MM DIGITAL SCREENING BILAT W/ TOMO AND CAD
8 series · 9 of 24 positions shown · non-contrast
Comparison: Previous exam(s).

CLINICAL DATA: Screening.

EXAM:
DIGITAL SCREENING BILATERAL MAMMOGRAM WITH TOMOSYNTHESIS AND CAD
TECHNIQUE: Bilateral screening digital craniocaudal and mediolateral oblique
mammograms were obtained. Bilateral screening digital breast
tomosynthesis was performed. The images were evaluated with
computer-aided detection.

[R MLO synth-2D]
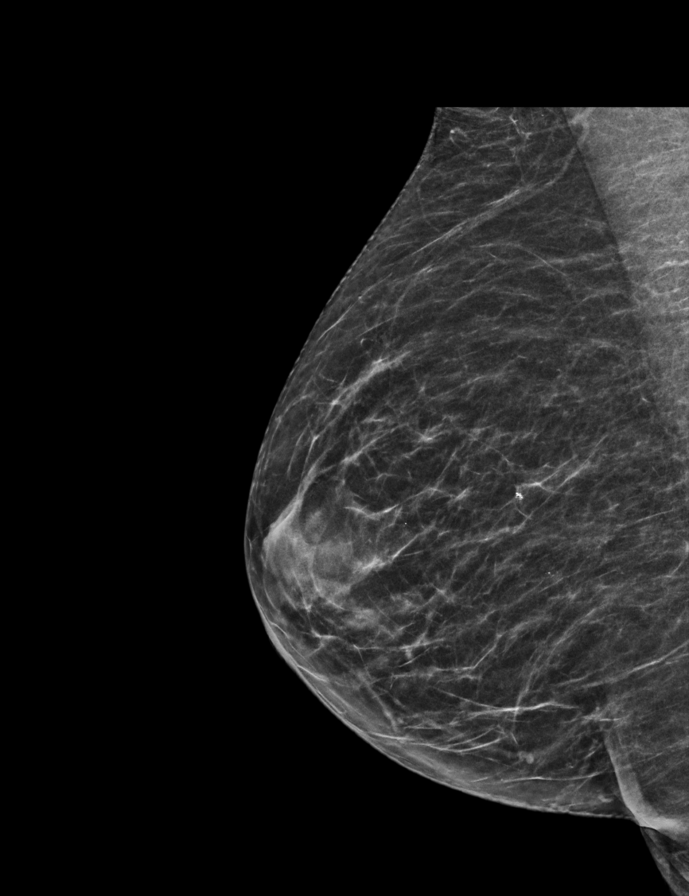

[R CC synth-2D]
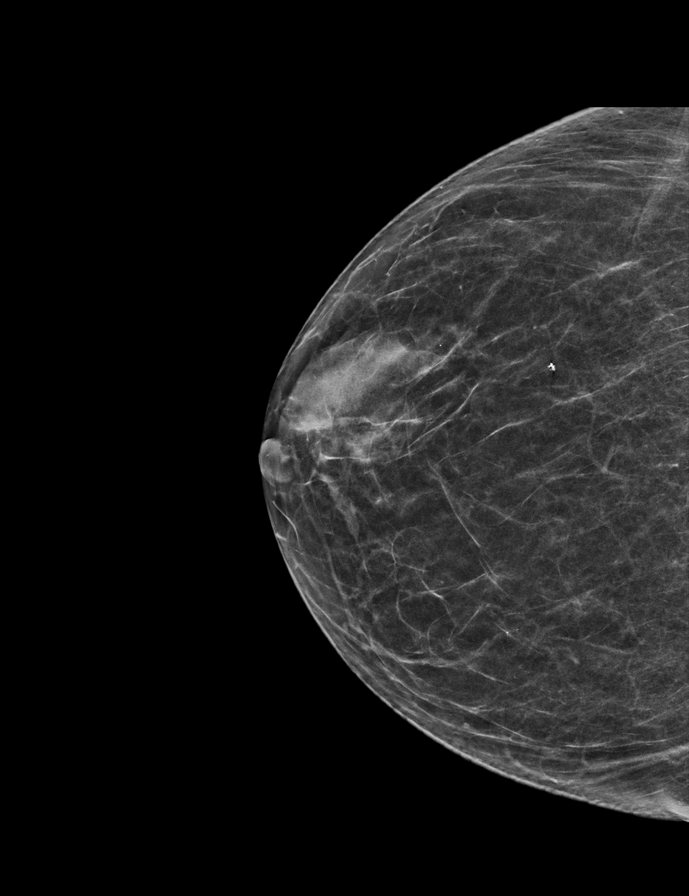

[L MLO synth-2D]
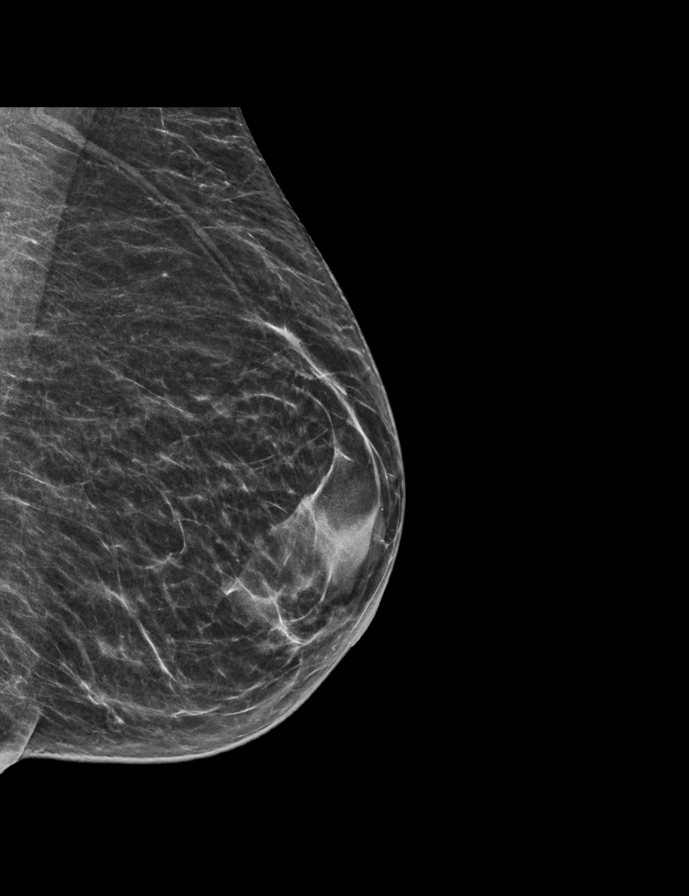

[L CC synth-2D]
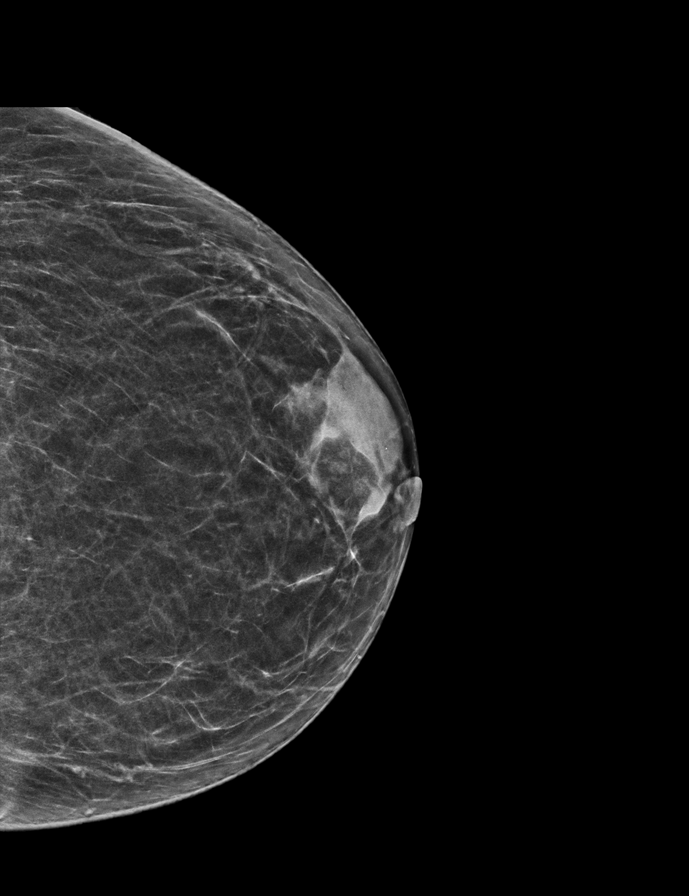

[R CC tomo · 2 of 51 frames shown]
[frame 17/51]
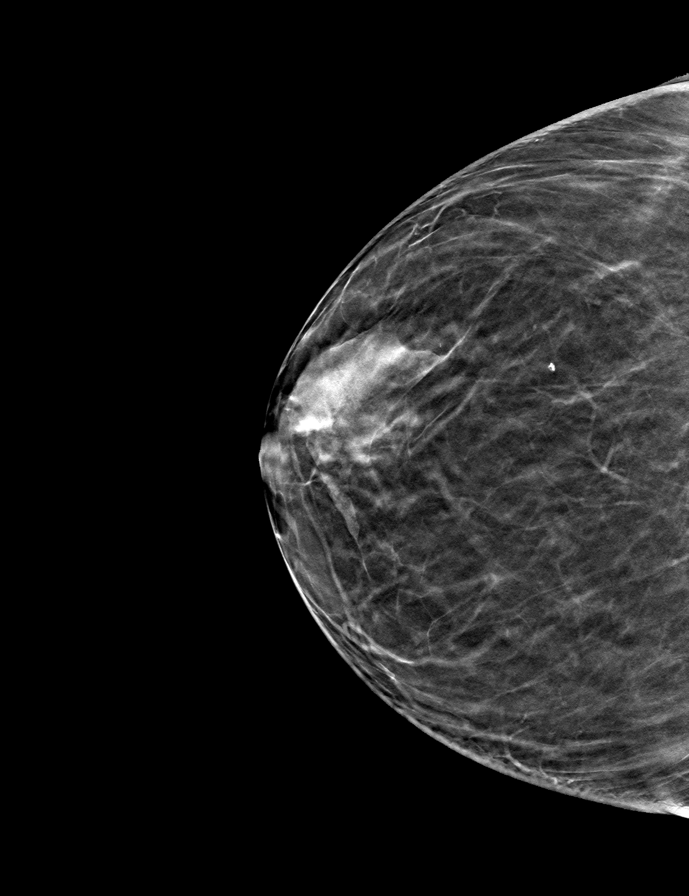
[frame 26/51]
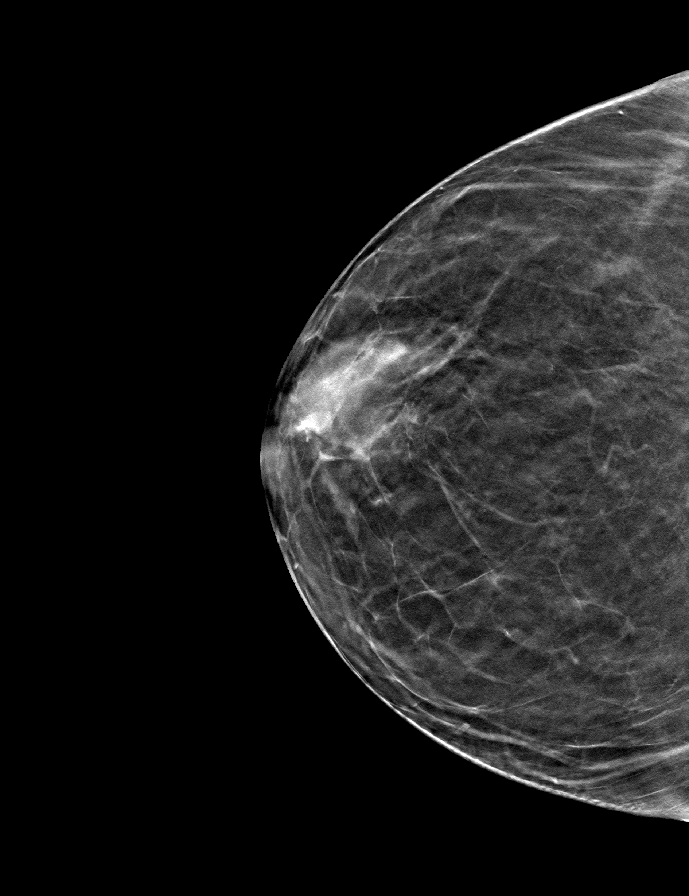

[R MLO tomo · tomo slice 27/52.0]
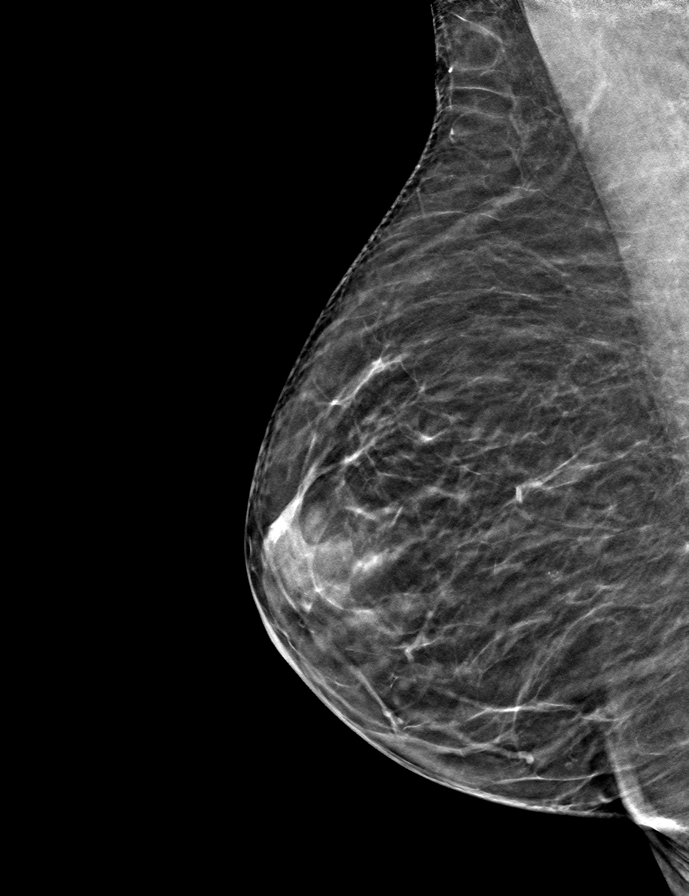

[L CC tomo · tomo slice 26/51.0]
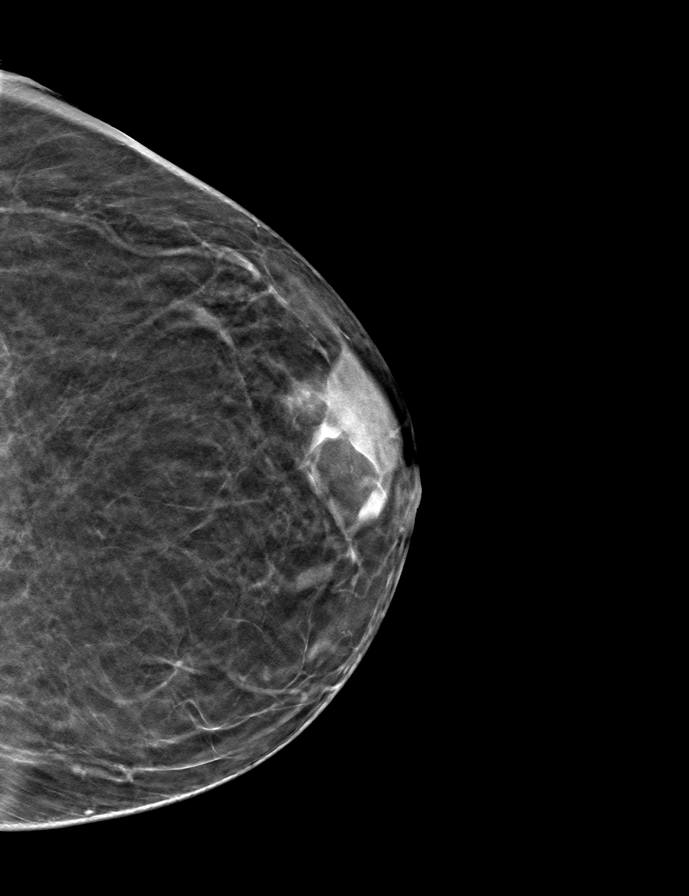

[L MLO tomo · tomo slice 26/51.0]
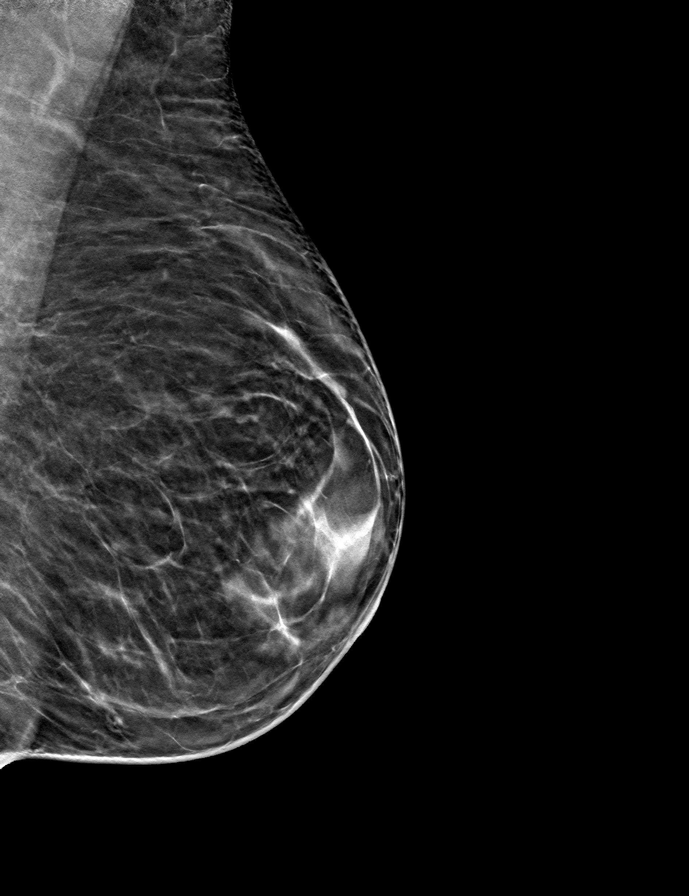

[9 of 24 positions shown; findings below may reference images not displayed]

ACR Breast Density Category b: There are scattered areas of
fibroglandular density.
FINDINGS: In the right breast, calcifications warrant further evaluation with
magnified views. In the left breast, no findings suspicious for
malignancy. The images were evaluated with computer-aided detection.
IMPRESSION: Further evaluation is suggested for calcifications in the right
breast.

RECOMMENDATION:
Diagnostic mammogram of the right breast. (Code:[id number])

The patient will be contacted regarding the findings, and additional
imaging will be scheduled.

BI-RADS CATEGORY  0: Incomplete. Need additional imaging evaluation
and/or prior mammograms for comparison.

## 2022-08-22 IMAGING — MG MM DIGITAL DIAGNOSTIC UNILAT*R*
4 series · 4 of 4 positions shown · non-contrast
Comparison: Previous exam(s).

CLINICAL DATA: The patient was called back for right breast
calcifications

EXAM:
DIGITAL DIAGNOSTIC UNILATERAL RIGHT MAMMOGRAM
TECHNIQUE: Right digital diagnostic mammography was performed. Mammographic
images were processed with CAD.

[R ML (1 of 2)]
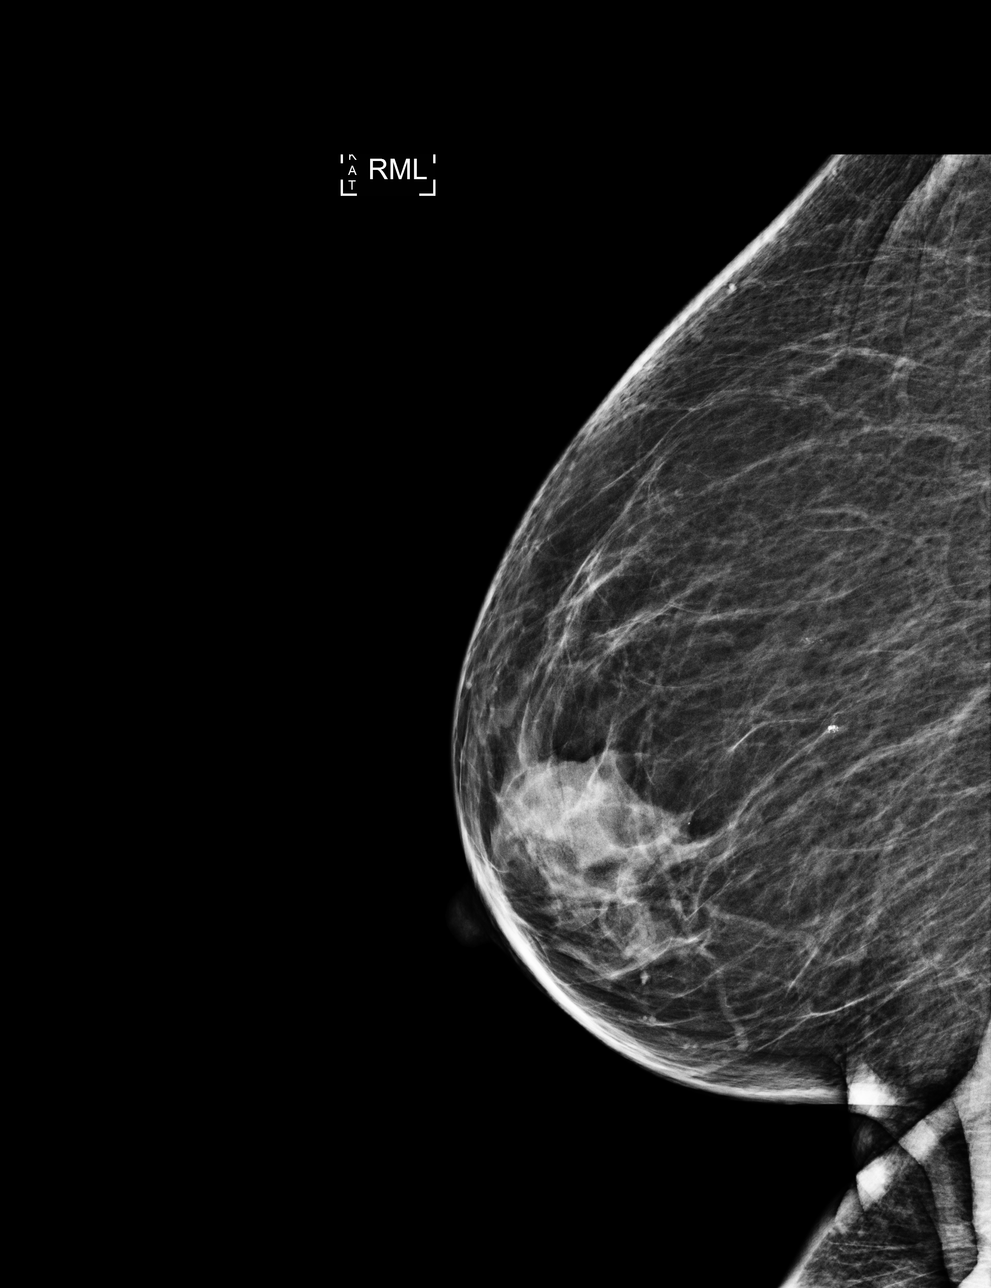

[R CC (1 of 2)]
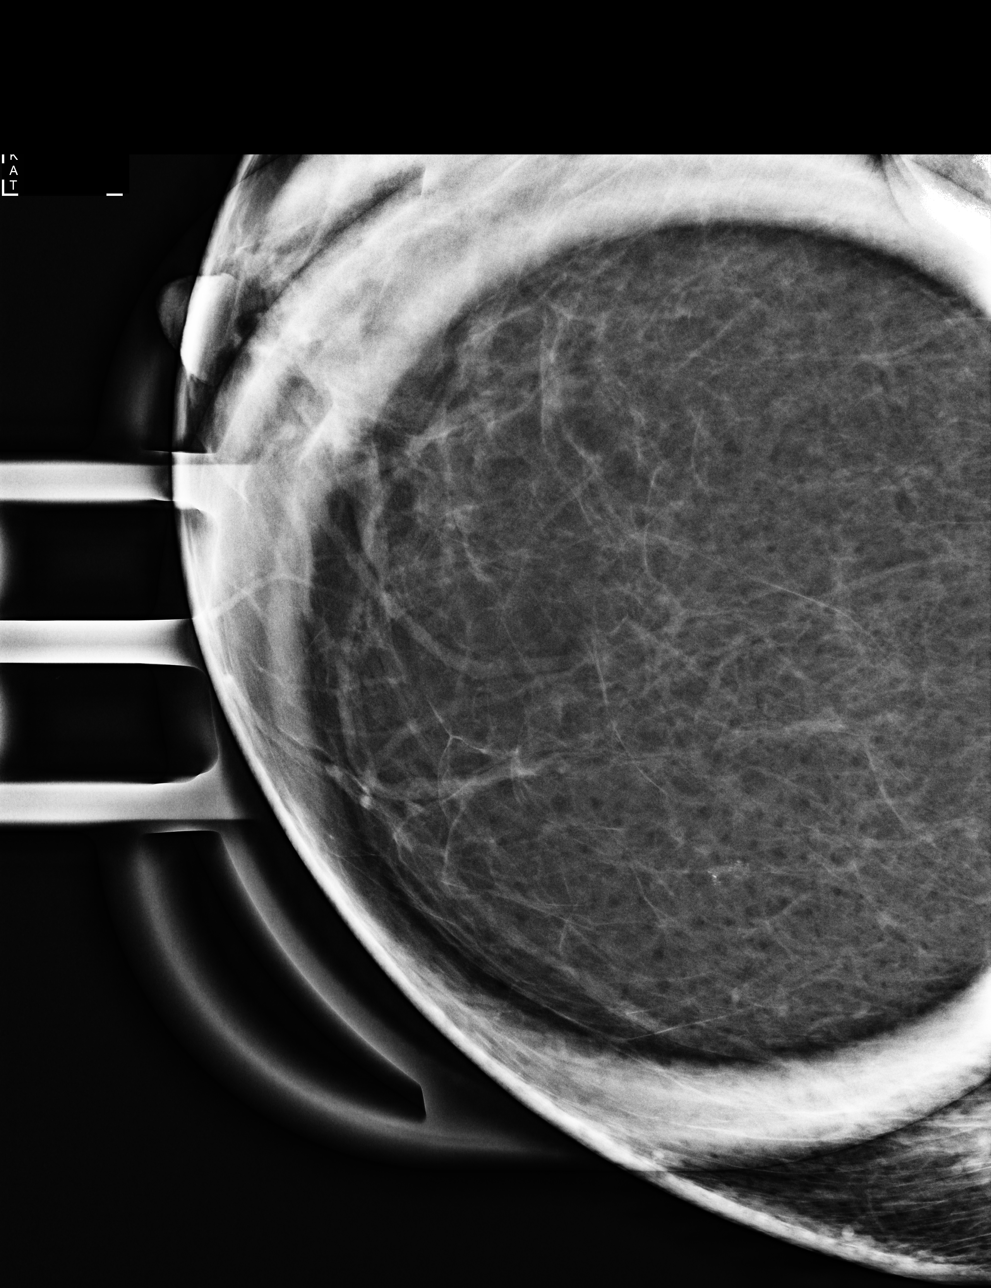

[R CC (2 of 2)]
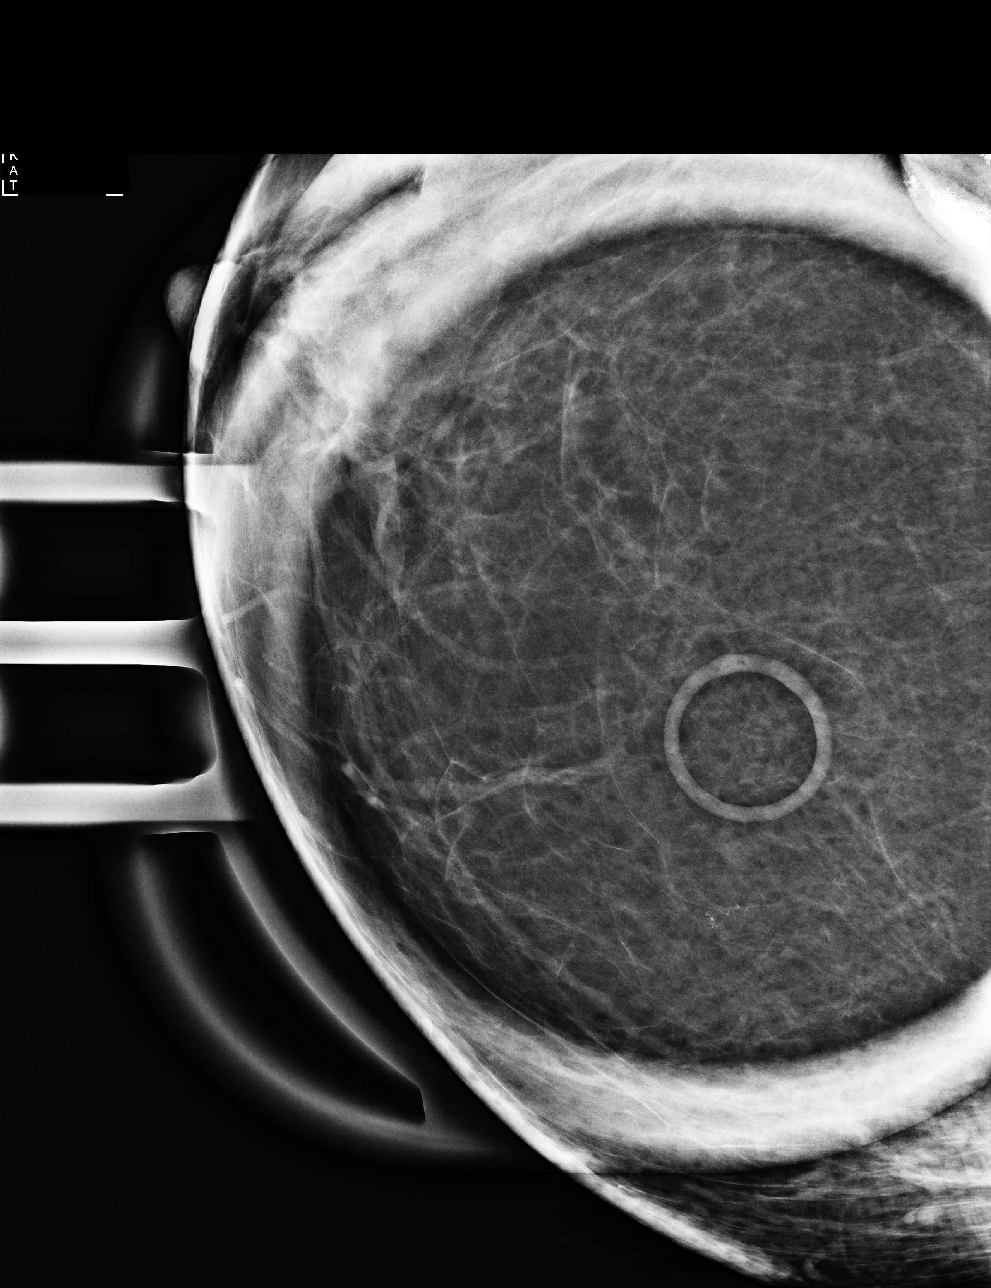

[R ML (2 of 2)]
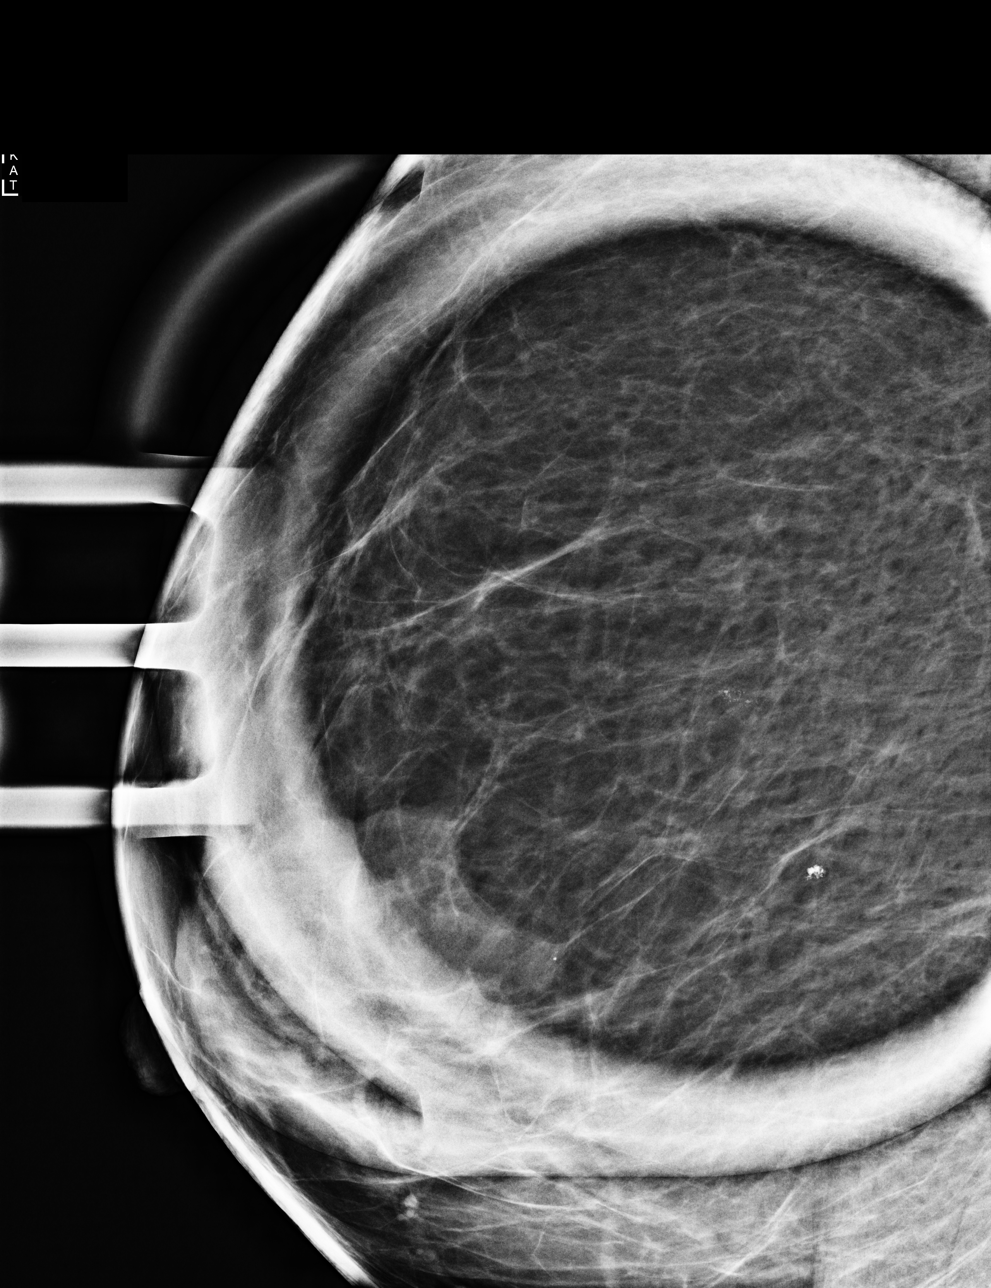

[4 of 4 positions shown; findings below may reference images not displayed]

ACR Breast Density Category b: There are scattered areas of
fibroglandular density.
FINDINGS: There is a group of persistent pleomorphic calcifications in the
medial central right breast spanning 3 mm. No other suspicious
findings.
IMPRESSION: Indeterminate right breast calcifications spanning 3 mm.

RECOMMENDATION:
Recommend stereotactic biopsy of the right breast calcifications.

I have discussed the findings and recommendations with the patient.
If applicable, a reminder letter will be sent to the patient
regarding the next appointment.

BI-RADS CATEGORY  4: Suspicious.

## 2022-11-15 DIAGNOSIS — Z860100 Personal history of colon polyps, unspecified: Secondary | ICD-10-CM | POA: Diagnosis not present

## 2022-11-15 DIAGNOSIS — K219 Gastro-esophageal reflux disease without esophagitis: Secondary | ICD-10-CM | POA: Diagnosis not present

## 2022-11-15 DIAGNOSIS — S32010D Wedge compression fracture of first lumbar vertebra, subsequent encounter for fracture with routine healing: Secondary | ICD-10-CM | POA: Diagnosis not present

## 2022-11-15 DIAGNOSIS — E782 Mixed hyperlipidemia: Secondary | ICD-10-CM | POA: Diagnosis not present

## 2022-11-15 DIAGNOSIS — E559 Vitamin D deficiency, unspecified: Secondary | ICD-10-CM | POA: Diagnosis not present

## 2022-11-15 DIAGNOSIS — Z0000 Encounter for general adult medical examination without abnormal findings: Secondary | ICD-10-CM | POA: Diagnosis not present

## 2022-11-16 ENCOUNTER — Other Ambulatory Visit: Payer: Self-pay | Admitting: Physician Assistant

## 2022-11-16 DIAGNOSIS — Z1231 Encounter for screening mammogram for malignant neoplasm of breast: Secondary | ICD-10-CM

## 2022-11-16 DIAGNOSIS — S32010A Wedge compression fracture of first lumbar vertebra, initial encounter for closed fracture: Secondary | ICD-10-CM

## 2023-01-01 DIAGNOSIS — Z85828 Personal history of other malignant neoplasm of skin: Secondary | ICD-10-CM | POA: Diagnosis not present

## 2023-01-01 DIAGNOSIS — L578 Other skin changes due to chronic exposure to nonionizing radiation: Secondary | ICD-10-CM | POA: Diagnosis not present

## 2023-01-01 DIAGNOSIS — D045 Carcinoma in situ of skin of trunk: Secondary | ICD-10-CM | POA: Diagnosis not present

## 2023-01-01 DIAGNOSIS — L814 Other melanin hyperpigmentation: Secondary | ICD-10-CM | POA: Diagnosis not present

## 2023-01-01 DIAGNOSIS — L570 Actinic keratosis: Secondary | ICD-10-CM | POA: Diagnosis not present

## 2023-01-01 DIAGNOSIS — L821 Other seborrheic keratosis: Secondary | ICD-10-CM | POA: Diagnosis not present

## 2023-01-01 DIAGNOSIS — D485 Neoplasm of uncertain behavior of skin: Secondary | ICD-10-CM | POA: Diagnosis not present

## 2023-06-13 ENCOUNTER — Ambulatory Visit
Admission: RE | Admit: 2023-06-13 | Discharge: 2023-06-13 | Disposition: A | Discharge status: Home / Self Care | Payer: BC Managed Care – PPO | Source: Ambulatory Visit | Attending: Physician Assistant | Admitting: Physician Assistant

## 2023-06-13 DIAGNOSIS — M8588 Other specified disorders of bone density and structure, other site: Secondary | ICD-10-CM | POA: Diagnosis not present

## 2023-06-13 DIAGNOSIS — S32010A Wedge compression fracture of first lumbar vertebra, initial encounter for closed fracture: Secondary | ICD-10-CM

## 2023-06-13 DIAGNOSIS — N958 Other specified menopausal and perimenopausal disorders: Secondary | ICD-10-CM | POA: Diagnosis not present

## 2023-07-04 ENCOUNTER — Ambulatory Visit
Admission: RE | Admit: 2023-07-04 | Discharge: 2023-07-04 | Disposition: A | Discharge status: Home / Self Care | Payer: BC Managed Care – PPO | Source: Ambulatory Visit | Attending: Physician Assistant | Admitting: Physician Assistant

## 2023-07-04 DIAGNOSIS — Z1231 Encounter for screening mammogram for malignant neoplasm of breast: Secondary | ICD-10-CM

## 2023-07-23 DIAGNOSIS — I10 Essential (primary) hypertension: Secondary | ICD-10-CM | POA: Diagnosis not present

## 2023-07-23 DIAGNOSIS — E559 Vitamin D deficiency, unspecified: Secondary | ICD-10-CM | POA: Diagnosis not present

## 2023-07-23 DIAGNOSIS — M810 Age-related osteoporosis without current pathological fracture: Secondary | ICD-10-CM | POA: Diagnosis not present

## 2023-08-02 DIAGNOSIS — M810 Age-related osteoporosis without current pathological fracture: Secondary | ICD-10-CM | POA: Diagnosis not present

## 2023-08-22 DIAGNOSIS — M810 Age-related osteoporosis without current pathological fracture: Secondary | ICD-10-CM | POA: Diagnosis not present

## 2023-08-22 DIAGNOSIS — I10 Essential (primary) hypertension: Secondary | ICD-10-CM | POA: Diagnosis not present

## 2023-08-26 DIAGNOSIS — H25013 Cortical age-related cataract, bilateral: Secondary | ICD-10-CM | POA: Diagnosis not present

## 2023-08-26 DIAGNOSIS — H524 Presbyopia: Secondary | ICD-10-CM | POA: Diagnosis not present

## 2023-08-26 DIAGNOSIS — H43393 Other vitreous opacities, bilateral: Secondary | ICD-10-CM | POA: Diagnosis not present

## 2023-08-26 DIAGNOSIS — H2513 Age-related nuclear cataract, bilateral: Secondary | ICD-10-CM | POA: Diagnosis not present

## 2023-10-01 DIAGNOSIS — D045 Carcinoma in situ of skin of trunk: Secondary | ICD-10-CM | POA: Diagnosis not present

## 2023-10-01 DIAGNOSIS — D485 Neoplasm of uncertain behavior of skin: Secondary | ICD-10-CM | POA: Diagnosis not present

## 2023-10-01 DIAGNOSIS — C44629 Squamous cell carcinoma of skin of left upper limb, including shoulder: Secondary | ICD-10-CM | POA: Diagnosis not present

## 2023-10-01 DIAGNOSIS — L570 Actinic keratosis: Secondary | ICD-10-CM | POA: Diagnosis not present

## 2023-10-03 DIAGNOSIS — I10 Essential (primary) hypertension: Secondary | ICD-10-CM | POA: Diagnosis not present

## 2023-10-03 DIAGNOSIS — M810 Age-related osteoporosis without current pathological fracture: Secondary | ICD-10-CM | POA: Diagnosis not present

## 2023-10-11 DIAGNOSIS — R3129 Other microscopic hematuria: Secondary | ICD-10-CM | POA: Diagnosis not present

## 2023-10-11 DIAGNOSIS — I10 Essential (primary) hypertension: Secondary | ICD-10-CM | POA: Diagnosis not present

## 2023-10-11 DIAGNOSIS — K219 Gastro-esophageal reflux disease without esophagitis: Secondary | ICD-10-CM | POA: Diagnosis not present

## 2023-10-11 DIAGNOSIS — E559 Vitamin D deficiency, unspecified: Secondary | ICD-10-CM | POA: Diagnosis not present

## 2023-10-11 DIAGNOSIS — E782 Mixed hyperlipidemia: Secondary | ICD-10-CM | POA: Diagnosis not present

## 2023-10-24 DIAGNOSIS — L942 Calcinosis cutis: Secondary | ICD-10-CM | POA: Diagnosis not present

## 2023-10-24 DIAGNOSIS — C44629 Squamous cell carcinoma of skin of left upper limb, including shoulder: Secondary | ICD-10-CM | POA: Diagnosis not present

## 2023-11-14 DIAGNOSIS — N390 Urinary tract infection, site not specified: Secondary | ICD-10-CM | POA: Diagnosis not present

## 2023-11-14 DIAGNOSIS — E871 Hypo-osmolality and hyponatremia: Secondary | ICD-10-CM | POA: Diagnosis not present

## 2023-12-18 DIAGNOSIS — I10 Essential (primary) hypertension: Secondary | ICD-10-CM | POA: Diagnosis not present

## 2023-12-25 ENCOUNTER — Other Ambulatory Visit: Payer: Self-pay | Admitting: Family Medicine

## 2023-12-25 DIAGNOSIS — I10 Essential (primary) hypertension: Secondary | ICD-10-CM

## 2024-01-03 DIAGNOSIS — I10 Essential (primary) hypertension: Secondary | ICD-10-CM | POA: Diagnosis not present

## 2024-01-22 ENCOUNTER — Other Ambulatory Visit

## 2024-03-24 ENCOUNTER — Other Ambulatory Visit
